# Patient Record
Sex: Male | Born: 1975 | Race: Black or African American | Hispanic: No | Marital: Single | Smoking: Current some day smoker
Health system: Southern US, Community
[De-identification: ages and names within clinical notes are randomized; demographics above are authoritative.]

## PROBLEM LIST (undated history)

## (undated) DIAGNOSIS — I10 Essential (primary) hypertension: Secondary | ICD-10-CM

---

## 1999-12-22 ENCOUNTER — Encounter: Payer: Self-pay | Admitting: Emergency Medicine

## 1999-12-22 ENCOUNTER — Emergency Department (HOSPITAL_COMMUNITY): Admission: EM | Admit: 1999-12-22 | Discharge: 1999-12-22 | Payer: Self-pay | Admitting: Emergency Medicine

## 2005-12-16 ENCOUNTER — Emergency Department (HOSPITAL_COMMUNITY): Admission: EM | Admit: 2005-12-16 | Discharge: 2005-12-16 | Payer: Self-pay | Admitting: Emergency Medicine

## 2010-08-04 ENCOUNTER — Emergency Department (HOSPITAL_COMMUNITY): Payer: Self-pay

## 2010-08-04 ENCOUNTER — Emergency Department (HOSPITAL_COMMUNITY)
Admission: EM | Admit: 2010-08-04 | Discharge: 2010-08-04 | Disposition: A | Discharge status: Home / Self Care | Payer: Self-pay | Attending: Emergency Medicine | Admitting: Emergency Medicine

## 2010-08-04 DIAGNOSIS — R059 Cough, unspecified: Secondary | ICD-10-CM | POA: Insufficient documentation

## 2010-08-04 DIAGNOSIS — I10 Essential (primary) hypertension: Secondary | ICD-10-CM | POA: Insufficient documentation

## 2010-08-04 DIAGNOSIS — R05 Cough: Secondary | ICD-10-CM | POA: Insufficient documentation

## 2010-08-04 DIAGNOSIS — R042 Hemoptysis: Secondary | ICD-10-CM | POA: Insufficient documentation

## 2010-08-04 DIAGNOSIS — R0989 Other specified symptoms and signs involving the circulatory and respiratory systems: Secondary | ICD-10-CM | POA: Insufficient documentation

## 2010-08-04 DIAGNOSIS — R6889 Other general symptoms and signs: Secondary | ICD-10-CM | POA: Insufficient documentation

## 2010-08-04 LAB — CBC
HCT: 41.2 % (ref 39.0–52.0)
Hemoglobin: 14.8 g/dL (ref 13.0–17.0)
MCH: 30.3 pg (ref 26.0–34.0)
MCHC: 35.9 g/dL (ref 30.0–36.0)
MCV: 84.3 fL (ref 78.0–100.0)
Platelets: 170 10*3/uL (ref 150–400)
RBC: 4.89 MIL/uL (ref 4.22–5.81)
RDW: 13.3 % (ref 11.5–15.5)
WBC: 6 10*3/uL (ref 4.0–10.5)

## 2010-08-04 LAB — POCT I-STAT, CHEM 8
BUN: 13 mg/dL (ref 6–23)
Calcium, Ion: 1.09 mmol/L — ABNORMAL LOW (ref 1.12–1.32)
Chloride: 102 mEq/L (ref 96–112)
Creatinine, Ser: 1.3 mg/dL (ref 0.4–1.5)
Glucose, Bld: 100 mg/dL — ABNORMAL HIGH (ref 70–99)
HCT: 44 % (ref 39.0–52.0)
Hemoglobin: 15 g/dL (ref 13.0–17.0)
Potassium: 3.5 mEq/L (ref 3.5–5.1)
Sodium: 140 mEq/L (ref 135–145)
TCO2: 24 mmol/L (ref 0–100)

## 2010-08-04 LAB — DIFFERENTIAL
Basophils Absolute: 0 10*3/uL (ref 0.0–0.1)
Basophils Relative: 0 % (ref 0–1)
Eosinophils Absolute: 0.1 10*3/uL (ref 0.0–0.7)
Eosinophils Relative: 1 % (ref 0–5)
Lymphocytes Relative: 19 % (ref 12–46)
Lymphs Abs: 1.1 10*3/uL (ref 0.7–4.0)
Monocytes Absolute: 0.6 10*3/uL (ref 0.1–1.0)
Monocytes Relative: 10 % (ref 3–12)
Neutro Abs: 4.2 10*3/uL (ref 1.7–7.7)
Neutrophils Relative %: 70 % (ref 43–77)

## 2014-11-13 ENCOUNTER — Emergency Department (HOSPITAL_COMMUNITY): Payer: BLUE CROSS/BLUE SHIELD

## 2014-11-13 ENCOUNTER — Ambulatory Visit (HOSPITAL_COMMUNITY)
Admission: EM | Admit: 2014-11-13 | Discharge: 2014-11-14 | Disposition: A | Discharge status: Home / Self Care | Payer: BLUE CROSS/BLUE SHIELD | Attending: Emergency Medicine | Admitting: Emergency Medicine

## 2014-11-13 ENCOUNTER — Encounter (HOSPITAL_COMMUNITY)
Admission: EM | Disposition: A | Discharge status: Home / Self Care | Payer: Self-pay | Source: Home / Self Care | Attending: Emergency Medicine

## 2014-11-13 ENCOUNTER — Emergency Department (HOSPITAL_COMMUNITY): Payer: BLUE CROSS/BLUE SHIELD | Admitting: Anesthesiology

## 2014-11-13 ENCOUNTER — Encounter (HOSPITAL_COMMUNITY): Payer: Self-pay | Admitting: Emergency Medicine

## 2014-11-13 DIAGNOSIS — S6991XA Unspecified injury of right wrist, hand and finger(s), initial encounter: Secondary | ICD-10-CM

## 2014-11-13 DIAGNOSIS — S61011A Laceration without foreign body of right thumb without damage to nail, initial encounter: Secondary | ICD-10-CM | POA: Insufficient documentation

## 2014-11-13 DIAGNOSIS — F17210 Nicotine dependence, cigarettes, uncomplicated: Secondary | ICD-10-CM | POA: Diagnosis not present

## 2014-11-13 DIAGNOSIS — S63054A Dislocation of other carpometacarpal joint of right hand, initial encounter: Secondary | ICD-10-CM | POA: Insufficient documentation

## 2014-11-13 DIAGNOSIS — I10 Essential (primary) hypertension: Secondary | ICD-10-CM | POA: Diagnosis not present

## 2014-11-13 HISTORY — DX: Essential (primary) hypertension: I10

## 2014-11-13 HISTORY — PX: PERCUTANEOUS PINNING: SHX2209

## 2014-11-13 LAB — COMPREHENSIVE METABOLIC PANEL
ALT: 26 U/L (ref 17–63)
AST: 26 U/L (ref 15–41)
Albumin: 3.6 g/dL (ref 3.5–5.0)
Alkaline Phosphatase: 67 U/L (ref 38–126)
Anion gap: 8 (ref 5–15)
BUN: 11 mg/dL (ref 6–20)
CALCIUM: 9 mg/dL (ref 8.9–10.3)
CO2: 26 mmol/L (ref 22–32)
Chloride: 106 mmol/L (ref 101–111)
Creatinine, Ser: 1.42 mg/dL — ABNORMAL HIGH (ref 0.61–1.24)
GFR calc Af Amer: >60 mL/min (ref >60)
GLUCOSE: 118 mg/dL — AB (ref 65–99)
Potassium: 3.6 mmol/L (ref 3.5–5.1)
SODIUM: 140 mmol/L (ref 135–145)
TOTAL PROTEIN: 7.6 g/dL (ref 6.5–8.1)
Total Bilirubin: 0.7 mg/dL (ref 0.3–1.2)

## 2014-11-13 LAB — CBC
HEMATOCRIT: 44.2 % (ref 39.0–52.0)
HEMOGLOBIN: 15 g/dL (ref 13.0–17.0)
MCH: 29.2 pg (ref 26.0–34.0)
MCHC: 33.9 g/dL (ref 30.0–36.0)
MCV: 86.2 fL (ref 78.0–100.0)
Platelets: 186 10*3/uL (ref 150–400)
RBC: 5.13 MIL/uL (ref 4.22–5.81)
RDW: 14 % (ref 11.5–15.5)
WBC: 5.3 10*3/uL (ref 4.0–10.5)

## 2014-11-13 SURGERY — PINNING, EXTREMITY, PERCUTANEOUS
Anesthesia: General | Site: Hand | Laterality: Right

## 2014-11-13 MED ORDER — FENTANYL CITRATE (PF) 250 MCG/5ML IJ SOLN
INTRAMUSCULAR | Status: DC | PRN
  Administered 2014-11-13 (×8): 50 ug via INTRAVENOUS
  Administered 2014-11-13: 100 ug via INTRAVENOUS

## 2014-11-13 MED ORDER — BUPIVACAINE HCL (PF) 0.25 % IJ SOLN
INTRAMUSCULAR | Status: AC
  Filled 2014-11-13: qty 30

## 2014-11-13 MED ORDER — NEOSTIGMINE METHYLSULFATE 10 MG/10ML IV SOLN
INTRAVENOUS | Status: AC
  Filled 2014-11-13: qty 1

## 2014-11-13 MED ORDER — CEFAZOLIN SODIUM-DEXTROSE 2-3 GM-% IV SOLR
INTRAVENOUS | Status: DC | PRN
  Administered 2014-11-13: 3 g via INTRAVENOUS

## 2014-11-13 MED ORDER — SUCCINYLCHOLINE CHLORIDE 20 MG/ML IJ SOLN
INTRAMUSCULAR | Status: AC
  Filled 2014-11-13: qty 1

## 2014-11-13 MED ORDER — SUCCINYLCHOLINE CHLORIDE 20 MG/ML IJ SOLN
INTRAMUSCULAR | Status: AC
  Filled 2014-11-13: qty 2

## 2014-11-13 MED ORDER — PROPOFOL 10 MG/ML IV BOLUS
INTRAVENOUS | Status: AC
  Filled 2014-11-13: qty 20

## 2014-11-13 MED ORDER — LIDOCAINE HCL (CARDIAC) 20 MG/ML IV SOLN
INTRAVENOUS | Status: AC
  Filled 2014-11-13: qty 10

## 2014-11-13 MED ORDER — FENTANYL CITRATE (PF) 100 MCG/2ML IJ SOLN
100.0000 ug | Freq: Once | INTRAMUSCULAR | Status: AC
  Administered 2014-11-13: 100 ug via INTRAVENOUS
  Filled 2014-11-13: qty 2

## 2014-11-13 MED ORDER — FENTANYL CITRATE (PF) 250 MCG/5ML IJ SOLN
INTRAMUSCULAR | Status: AC
  Filled 2014-11-13: qty 5

## 2014-11-13 MED ORDER — LIDOCAINE HCL (CARDIAC) 20 MG/ML IV SOLN
INTRAVENOUS | Status: AC
  Filled 2014-11-13: qty 5

## 2014-11-13 MED ORDER — LIDOCAINE HCL (CARDIAC) 20 MG/ML IV SOLN: INTRAVENOUS | Status: DC | PRN

## 2014-11-13 MED ORDER — HYDROMORPHONE HCL 1 MG/ML IJ SOLN
0.2500 mg | INTRAMUSCULAR | Status: DC | PRN
  Administered 2014-11-14 (×2): 0.5 mg via INTRAVENOUS

## 2014-11-13 MED ORDER — OXYCODONE-ACETAMINOPHEN 5-325 MG PO TABS: ORAL_TABLET | ORAL | Status: AC

## 2014-11-13 MED ORDER — ROCURONIUM BROMIDE 50 MG/5ML IV SOLN
INTRAVENOUS | Status: AC
  Filled 2014-11-13: qty 1

## 2014-11-13 MED ORDER — CEFAZOLIN SODIUM-DEXTROSE 2-3 GM-% IV SOLR
INTRAVENOUS | Status: AC
  Filled 2014-11-13: qty 100

## 2014-11-13 MED ORDER — PHENYLEPHRINE 40 MCG/ML (10ML) SYRINGE FOR IV PUSH (FOR BLOOD PRESSURE SUPPORT)
PREFILLED_SYRINGE | INTRAVENOUS | Status: AC
  Filled 2014-11-13: qty 10

## 2014-11-13 MED ORDER — LISINOPRIL 10 MG PO TABS
10.0000 mg | ORAL_TABLET | Freq: Once | ORAL | Status: AC
  Administered 2014-11-13: 10 mg via ORAL
  Filled 2014-11-13: qty 1

## 2014-11-13 MED ORDER — MEPERIDINE HCL 25 MG/ML IJ SOLN: 6.2500 mg | INTRAMUSCULAR | Status: DC | PRN

## 2014-11-13 MED ORDER — EPHEDRINE SULFATE 50 MG/ML IJ SOLN
INTRAMUSCULAR | Status: AC
  Filled 2014-11-13: qty 1

## 2014-11-13 MED ORDER — PROPOFOL 10 MG/ML IV BOLUS
INTRAVENOUS | Status: DC | PRN
  Administered 2014-11-13 (×2): 200 mg via INTRAVENOUS

## 2014-11-13 MED ORDER — ARTIFICIAL TEARS OP OINT
TOPICAL_OINTMENT | OPHTHALMIC | Status: AC
  Filled 2014-11-13: qty 3.5

## 2014-11-13 MED ORDER — MIDAZOLAM HCL 5 MG/5ML IJ SOLN
INTRAMUSCULAR | Status: DC | PRN
  Administered 2014-11-13: 2 mg via INTRAVENOUS

## 2014-11-13 MED ORDER — EPHEDRINE SULFATE 50 MG/ML IJ SOLN
INTRAMUSCULAR | Status: DC | PRN
  Administered 2014-11-13: 10 mg via INTRAVENOUS

## 2014-11-13 MED ORDER — SUCCINYLCHOLINE CHLORIDE 20 MG/ML IJ SOLN
INTRAMUSCULAR | Status: DC | PRN
  Administered 2014-11-13: 160 mg via INTRAVENOUS

## 2014-11-13 MED ORDER — PROMETHAZINE HCL 25 MG/ML IJ SOLN
6.2500 mg | INTRAMUSCULAR | Status: DC | PRN
  Administered 2014-11-13: 6.25 mg via INTRAVENOUS

## 2014-11-13 MED ORDER — LACTATED RINGERS IV SOLN
INTRAVENOUS | Status: DC | PRN
  Administered 2014-11-13 (×2): via INTRAVENOUS

## 2014-11-13 MED ORDER — LIDOCAINE HCL (PF) 1 % IJ SOLN
10.0000 mL | Freq: Once | INTRAMUSCULAR | Status: AC
  Administered 2014-11-13: 10 mL
  Filled 2014-11-13: qty 10

## 2014-11-13 MED ORDER — PROMETHAZINE HCL 25 MG/ML IJ SOLN
INTRAMUSCULAR | Status: AC
  Filled 2014-11-13: qty 1

## 2014-11-13 MED ORDER — GLYCOPYRROLATE 0.2 MG/ML IJ SOLN
INTRAMUSCULAR | Status: AC
  Filled 2014-11-13: qty 2

## 2014-11-13 MED ORDER — ONDANSETRON HCL 4 MG/2ML IJ SOLN
INTRAMUSCULAR | Status: AC
  Filled 2014-11-13: qty 2

## 2014-11-13 MED ORDER — PHENYLEPHRINE HCL 10 MG/ML IJ SOLN
INTRAMUSCULAR | Status: AC
  Filled 2014-11-13: qty 1

## 2014-11-13 MED ORDER — PHENYLEPHRINE HCL 10 MG/ML IJ SOLN
INTRAMUSCULAR | Status: DC | PRN
  Administered 2014-11-13: 40 ug via INTRAVENOUS

## 2014-11-13 MED ORDER — MIDAZOLAM HCL 2 MG/2ML IJ SOLN
INTRAMUSCULAR | Status: AC
  Filled 2014-11-13: qty 2

## 2014-11-13 MED ORDER — AMLODIPINE BESYLATE 5 MG PO TABS
10.0000 mg | ORAL_TABLET | Freq: Once | ORAL | Status: AC
  Administered 2014-11-13: 10 mg via ORAL
  Filled 2014-11-13: qty 2

## 2014-11-13 MED ORDER — TETANUS-DIPHTH-ACELL PERTUSSIS 5-2.5-18.5 LF-MCG/0.5 IM SUSP
0.5000 mL | Freq: Once | INTRAMUSCULAR | Status: AC
  Administered 2014-11-13: 0.5 mL via INTRAMUSCULAR
  Filled 2014-11-13: qty 0.5

## 2014-11-13 MED ORDER — MIDAZOLAM HCL 2 MG/2ML IJ SOLN: 0.5000 mg | Freq: Once | INTRAMUSCULAR | Status: AC | PRN

## 2014-11-13 MED ORDER — BUPIVACAINE HCL (PF) 0.25 % IJ SOLN
INTRAMUSCULAR | Status: DC | PRN
Start: 2014-11-13 — End: 2014-11-13
  Administered 2014-11-13: 10 mL

## 2014-11-13 MED ORDER — SULFAMETHOXAZOLE-TRIMETHOPRIM 800-160 MG PO TABS: 1.0000 | ORAL_TABLET | Freq: Two times a day (BID) | ORAL | Status: AC

## 2014-11-13 MED ORDER — ROCURONIUM BROMIDE 50 MG/5ML IV SOLN
INTRAVENOUS | Status: AC
  Filled 2014-11-13: qty 2

## 2014-11-13 SURGICAL SUPPLY — 51 items
APL SKNCLS STERI-STRIP NONHPOA (GAUZE/BANDAGES/DRESSINGS) ×1
BANDAGE COBAN STERILE 2 (GAUZE/BANDAGES/DRESSINGS) IMPLANT
BANDAGE ELASTIC 3 VELCRO ST LF (GAUZE/BANDAGES/DRESSINGS) ×3 IMPLANT
BANDAGE ELASTIC 4 VELCRO ST LF (GAUZE/BANDAGES/DRESSINGS) ×3 IMPLANT
BENZOIN TINCTURE PRP APPL 2/3 (GAUZE/BANDAGES/DRESSINGS) ×3 IMPLANT
BNDG CMPR 9X4 STRL LF SNTH (GAUZE/BANDAGES/DRESSINGS) ×2
BNDG ESMARK 4X9 LF (GAUZE/BANDAGES/DRESSINGS) ×6 IMPLANT
BNDG GAUZE ELAST 4 BULKY (GAUZE/BANDAGES/DRESSINGS) ×3 IMPLANT
CLOSURE WOUND 1/2 X4 (GAUZE/BANDAGES/DRESSINGS) ×1
CORDS BIPOLAR (ELECTRODE) ×3 IMPLANT
COVER SURGICAL LIGHT HANDLE (MISCELLANEOUS) ×3 IMPLANT
CUFF TOURNIQUET SINGLE 18IN (TOURNIQUET CUFF) IMPLANT
CUFF TOURNIQUET SINGLE 24IN (TOURNIQUET CUFF) IMPLANT
DECANTER SPIKE VIAL GLASS SM (MISCELLANEOUS) ×3 IMPLANT
DRAPE C-ARM MINI 42X72 WSTRAPS (DRAPES) ×3 IMPLANT
DRAPE OEC MINIVIEW 54X84 (DRAPES) ×3 IMPLANT
DRAPE SURG 17X23 STRL (DRAPES) ×3 IMPLANT
DRSG EMULSION OIL 3X3 NADH (GAUZE/BANDAGES/DRESSINGS) ×3 IMPLANT
GAUZE SPONGE 4X4 12PLY STRL (GAUZE/BANDAGES/DRESSINGS) ×3 IMPLANT
GAUZE SPONGE 4X4 16PLY XRAY LF (GAUZE/BANDAGES/DRESSINGS) ×3 IMPLANT
GAUZE XEROFORM 1X8 LF (GAUZE/BANDAGES/DRESSINGS) ×6 IMPLANT
GLOVE BIO SURGEON STRL SZ7.5 (GLOVE) ×6 IMPLANT
GLOVE BIO SURGEON STRL SZ8 (GLOVE) ×3 IMPLANT
GLOVE BIOGEL PI IND STRL 7.0 (GLOVE) ×1 IMPLANT
GLOVE BIOGEL PI IND STRL 8 (GLOVE) ×1 IMPLANT
GLOVE BIOGEL PI INDICATOR 7.0 (GLOVE) ×2
GLOVE BIOGEL PI INDICATOR 8 (GLOVE) ×2
GOWN STRL REUS W/ TWL LRG LVL3 (GOWN DISPOSABLE) ×2 IMPLANT
GOWN STRL REUS W/TWL LRG LVL3 (GOWN DISPOSABLE) ×6
KIT BASIN OR (CUSTOM PROCEDURE TRAY) ×3 IMPLANT
KIT ROOM TURNOVER OR (KITS) ×3 IMPLANT
NEEDLE HYPO 25GX1X1/2 BEV (NEEDLE) ×3 IMPLANT
NS IRRIG 1000ML POUR BTL (IV SOLUTION) ×3 IMPLANT
PACK ORTHO EXTREMITY (CUSTOM PROCEDURE TRAY) ×3 IMPLANT
PAD ARMBOARD 7.5X6 YLW CONV (MISCELLANEOUS) ×6 IMPLANT
PAD CAST 4YDX4 CTTN HI CHSV (CAST SUPPLIES) ×1 IMPLANT
PADDING CAST ABS 4INX4YD NS (CAST SUPPLIES) ×2
PADDING CAST ABS COTTON 4X4 ST (CAST SUPPLIES) ×1 IMPLANT
PADDING CAST COTTON 4X4 STRL (CAST SUPPLIES) ×3
SPLINT PLASTER CAST XFAST 4X15 (CAST SUPPLIES) ×1 IMPLANT
SPLINT PLASTER XTRA FAST SET 4 (CAST SUPPLIES) ×2
SPONGE GAUZE 4X4 12PLY STER LF (GAUZE/BANDAGES/DRESSINGS) ×3 IMPLANT
STRIP CLOSURE SKIN 1/2X4 (GAUZE/BANDAGES/DRESSINGS) ×2 IMPLANT
SUCTION FRAZIER TIP 10 FR DISP (SUCTIONS) ×3 IMPLANT
SUT ETHILON 4 0 P 3 18 (SUTURE) IMPLANT
SUT ETHILON 4 0 PS 2 18 (SUTURE) ×9 IMPLANT
SYR CONTROL 10ML LL (SYRINGE) ×3 IMPLANT
TOWEL OR 17X24 6PK STRL BLUE (TOWEL DISPOSABLE) ×6 IMPLANT
TOWEL OR 17X26 10 PK STRL BLUE (TOWEL DISPOSABLE) ×3 IMPLANT
TUBE CONNECTING 12'X1/4 (SUCTIONS) ×1
TUBE CONNECTING 12X1/4 (SUCTIONS) ×2 IMPLANT

## 2014-11-13 NOTE — Op Note (Signed)
Intra-operative fluoroscopic images in the AP, lateral, and oblique views were taken and evaluated by myself.  Reduction and hardware placement were confirmed.  There was no intraarticular penetration of permanent hardware.

## 2014-11-13 NOTE — ED Notes (Addendum)
Hand Surgeon bedside.

## 2014-11-13 NOTE — Op Note (Signed)
[id number] 
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
## 2014-11-13 NOTE — ED Notes (Signed)
consetn signed.

## 2014-11-13 NOTE — ED Notes (Signed)
Dr Fredna Dow at bedside.

## 2014-11-13 NOTE — Brief Op Note (Addendum)
11/13/2014  11:08 PM  PATIENT:  Guy Franco  39 y.o. male  PRE-OPERATIVE DIAGNOSIS:  RIGHT SECOND THORUGH FIFTH METACARPAL DISLOCATIONS  POST-OPERATIVE DIAGNOSIS:  RIGHT SECOND THORUGH FIFTH CMC dislocations  PROCEDURE:  Procedure(s): OPEN REDUCTION RIGHT SECOND THROUGH FIFTH METACARPAL DISOCATIONS  AND PERCUTANEOUS PINNING EXTREMITY, FASCIOTOMIES (Right), exploration and closure right hand wound  SURGEON:  Surgeon(s) and Role:    * Leanora Cover, MD - Primary  PHYSICIAN ASSISTANT:   ASSISTANTS: none   ANESTHESIA:   general  EBL:  Total I/O In: 1500 [I.V.:1500] Out: -   BLOOD ADMINISTERED:none  DRAINS: none   LOCAL MEDICATIONS USED:  MARCAINE     SPECIMEN:  No Specimen  DISPOSITION OF SPECIMEN:  N/A  COUNTS:  YES  TOURNIQUET:  Right arm: ~85 minutes at 250 mmHg  DICTATION: .Other Dictation: Dictation Number [id number]  PLAN OF CARE: Discharge to home after PACU  PATIENT DISPOSITION:  PACU - hemodynamically stable.
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
## 2014-11-13 NOTE — Anesthesia Procedure Notes (Signed)
Procedure Name: Intubation Date/Time: 11/13/2014 9:25 PM Performed by: Maude Leriche D Pre-anesthesia Checklist: Patient identified, Emergency Drugs available, Suction available, Patient being monitored and Timeout performed Patient Re-evaluated:Patient Re-evaluated prior to inductionOxygen Delivery Method: Circle system utilized Preoxygenation: Pre-oxygenation with 100% oxygen Intubation Type: IV induction, Rapid sequence and Cricoid Pressure applied Laryngoscope Size: Glidescope (Elective Glidescope) Grade View: Grade I Tube type: Oral Tube size: 7.5 mm Number of attempts: 2 (Ett cuff torn with first DL. Blue stylet and Glidescope used to pass second ETT. + ETCO2, =BBS, atraumatic intubation.  Pt stable. ) Airway Equipment and Method: Stylet,  Video-laryngoscopy and Bougie stylet Placement Confirmation: ETT inserted through vocal cords under direct vision,  positive ETCO2 and breath sounds checked- equal and bilateral Secured at: 23 cm Tube secured with: Tape Dental Injury: Teeth and Oropharynx as per pre-operative assessment
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
## 2014-11-13 NOTE — Transfer of Care (Signed)
Immediate Anesthesia Transfer of Care Note  Patient: Cole Ramos  Procedure(s) Performed: Procedure(s): OPEN REDUCTION RIGHT SECOND THROUGH FIFTH METACARPAL DISOCATIONS  AND PERCUTANEOUS PINNING, FASCIOTOMIES (Right)  Patient Location: PACU  Anesthesia Type:General  Level of Consciousness: awake  Airway & Oxygen Therapy: Patient Spontanous Breathing and Patient connected to face mask oxygen  Post-op Assessment: Report given to RN and Post -op Vital signs reviewed and stable  Post vital signs: Reviewed and stable  Last Vitals:  Filed Vitals:   11/13/14 2325  BP:   Pulse:   Temp: 36.7 C  Resp:     Complications: No apparent anesthesia complications

## 2014-11-13 NOTE — Anesthesia Preprocedure Evaluation (Addendum)
Anesthesia Evaluation  Patient identified by MRN, date of birth, ID band Patient awake    Reviewed: Allergy & Precautions, NPO status , Patient's Chart, lab work & pertinent test results  History of Anesthesia Complications Negative for: history of anesthetic complications  Airway Mallampati: II  TM Distance: >3 FB Neck ROM: Full    Dental  (+) Dental Advisory Given   Pulmonary Current Smoker,  breath sounds clear to auscultation        Cardiovascular hypertension, Pt. on medications - anginaRhythm:Regular Rate:Normal     Neuro/Psych negative neurological ROS     GI/Hepatic negative GI ROS, Neg liver ROS,   Endo/Other  Morbid obesity  Renal/GU negative Renal ROS     Musculoskeletal   Abdominal (+) + obese,   Peds  Hematology negative hematology ROS (+)   Anesthesia Other Findings   Reproductive/Obstetrics                           Anesthesia Physical Anesthesia Plan  ASA: III and emergent  Anesthesia Plan: General   Post-op Pain Management:    Induction: Intravenous, Rapid sequence and Cricoid pressure planned  Airway Management Planned: Oral ETT  Additional Equipment:   Intra-op Plan:   Post-operative Plan: Extubation in OR  Informed Consent: I have reviewed the patients History and Physical, chart, labs and discussed the procedure including the risks, benefits and alternatives for the proposed anesthesia with the patient or authorized representative who has indicated his/her understanding and acceptance.   Dental advisory given  Plan Discussed with: Surgeon, CRNA and Anesthesiologist  Anesthesia Plan Comments: (Plan routine monitors, GETA with RSI)       Anesthesia Quick Evaluation

## 2014-11-13 NOTE — Discharge Instructions (Signed)

## 2014-11-14 MED ORDER — HYDRALAZINE HCL 20 MG/ML IJ SOLN
5.0000 mg | Freq: Once | INTRAMUSCULAR | Status: AC
  Administered 2014-11-14 (×3): 5 mg via INTRAVENOUS

## 2014-11-14 MED ORDER — HYDRALAZINE HCL 20 MG/ML IJ SOLN
INTRAMUSCULAR | Status: AC
  Administered 2014-11-14: 5 mg via INTRAVENOUS
  Filled 2014-11-14: qty 1

## 2014-11-14 MED ORDER — HYDROMORPHONE HCL 1 MG/ML IJ SOLN
INTRAMUSCULAR | Status: AC
  Filled 2014-11-14: qty 1

## 2014-11-14 NOTE — Anesthesia Postprocedure Evaluation (Signed)
  Anesthesia Post-op Note  Patient: Cole Ramos  Procedure(s) Performed: Procedure(s): OPEN REDUCTION RIGHT SECOND THROUGH FIFTH METACARPAL DISOCATIONS  AND PERCUTANEOUS PINNING, FASCIOTOMIES (Right)  Patient Location: PACU  Anesthesia Type:General  Level of Consciousness: awake, alert , oriented and patient cooperative  Airway and Oxygen Therapy: Patient Spontanous Breathing  Post-op Pain: none  Post-op Assessment: Post-op Vital signs reviewed, Patient's Cardiovascular Status Stable, Respiratory Function Stable, Patent Airway, No signs of Nausea or vomiting and Pain level controlled  Post-op Vital Signs: Reviewed and stable  Last Vitals:  Filed Vitals:   11/14/14 0214  BP: 156/97  Pulse: 109  Temp:   Resp: 18    Complications: No apparent anesthesia complications

## 2014-11-15 ENCOUNTER — Encounter (HOSPITAL_COMMUNITY): Payer: Self-pay | Admitting: Orthopedic Surgery
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
